# Patient Record
Sex: Male | Born: 1990 | Race: White | Hispanic: No | Marital: Married | Smoking: Never smoker
Health system: Southern US, Community
[De-identification: ages and names within clinical notes are randomized; demographics above are authoritative.]

---
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
## 2010-08-26 ENCOUNTER — Encounter: Payer: Self-pay | Admitting: Specialist

## 2010-09-07 NOTE — Letter (Signed)
Summary: New Patient letter  [facility]  [address] [place], [place] [address]   Phone: [phone]  Fax: [phone]       11/15/2009 MRN: [id number]  Andrew Spencer [address] [place], [place]  [address]  Dear Andrew Spencer,  Welcome to the Gastroenterology Division at [facility].    You are scheduled to see Dr.  Juanda Chance on 01/09/2010 at 9:15AM on the 3rd floor at [facility], [address]  We ask that you try to arrive at our office 15 minutes prior to your appointment time to allow for check-in.  We would like you to complete the enclosed self-administered evaluation form prior to your visit and bring it with you on the day of your appointment.  We will review it with you.  Also, please bring a complete list of all your medications or, if you prefer, bring the medication bottles and we will list them.  Please bring your insurance card so that we may make a copy of it.  If your insurance requires a referral to see a specialist, please bring your referral form from your primary care physician.  Co-payments are due at the time of your visit and may be paid by cash, check or credit card.     Your office visit will consist of a consult with your physician (includes a physical exam), any laboratory testing he/she may order, scheduling of any necessary diagnostic testing (e.g. x-ray, ultrasound, CT-scan), and scheduling of a procedure (e.g. Endoscopy, Colonoscopy) if required.  Please allow enough time on your schedule to allow for any/all of these possibilities.    If you cannot keep your appointment, please call [phone] to cancel or reschedule prior to your appointment date.  This allows [place] the opportunity to schedule an appointment for another patient in need of care.  If you do not cancel or reschedule by 5 p.m. the business day prior to your appointment date, you will be charged a $50.00 late cancellation/no-show fee.    Thank you for choosing  [facility] for your medical needs.  We appreciate the opportunity to care for you.  Please visit [place] at our website  to learn more about our practice.                     Sincerely,                                                             The Gastroenterology Division

## 2010-12-03 IMAGING — CR DG SHOULDER 2+V*L*
3 series · 3 of 3 positions shown · non-contrast
Comparison: None

CLINICAL DATA: Left shoulder injury.

LEFT SHOULDER - 2+ VIEW

[w shoulder ap internal left]
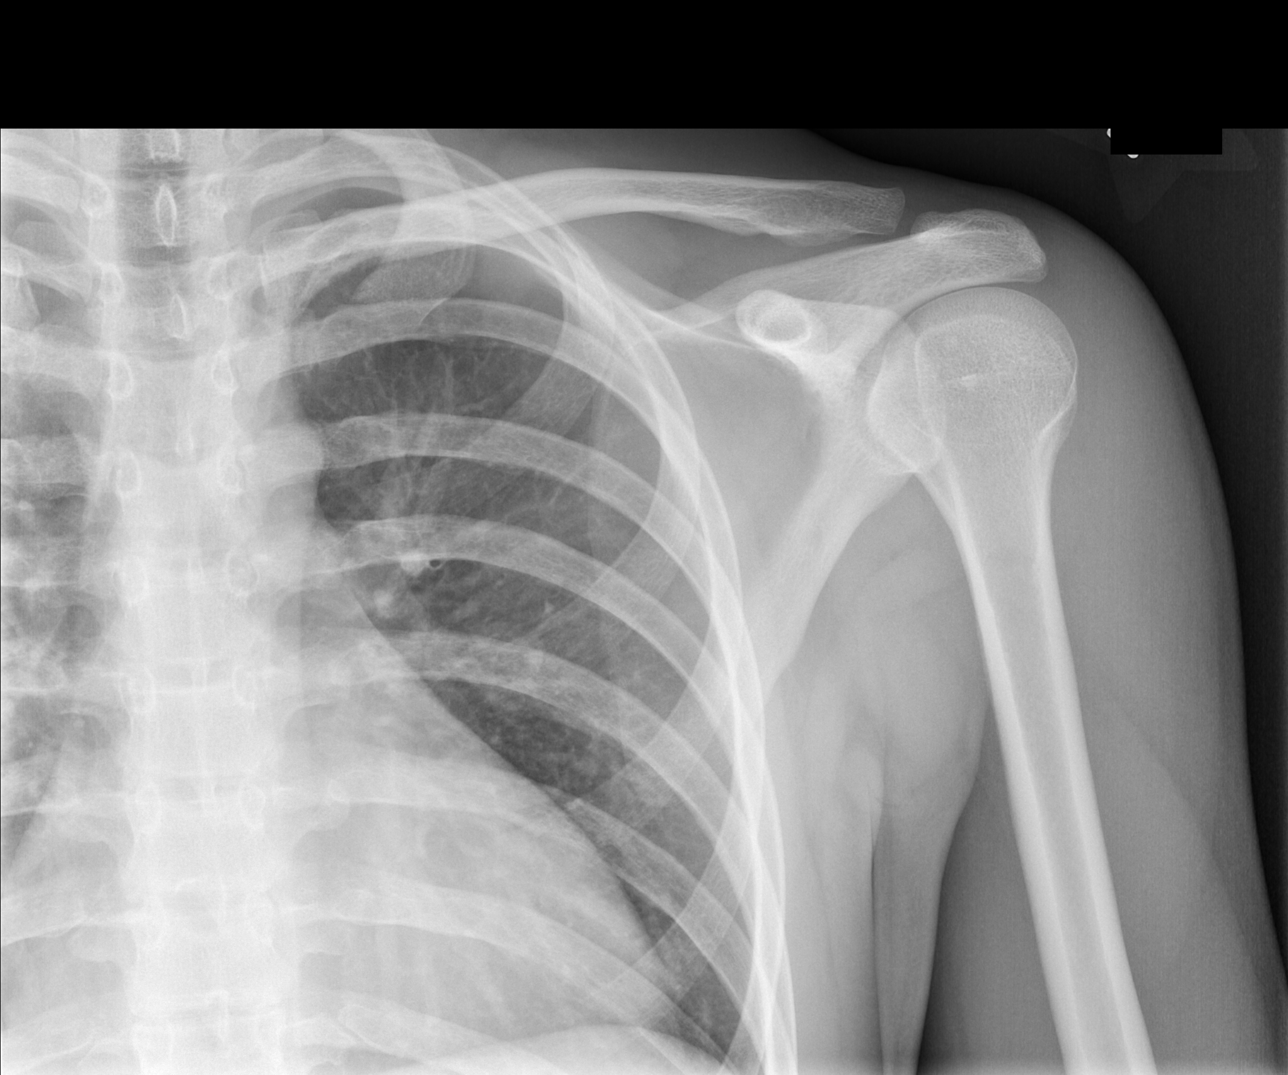

[w shoulder ap external left]
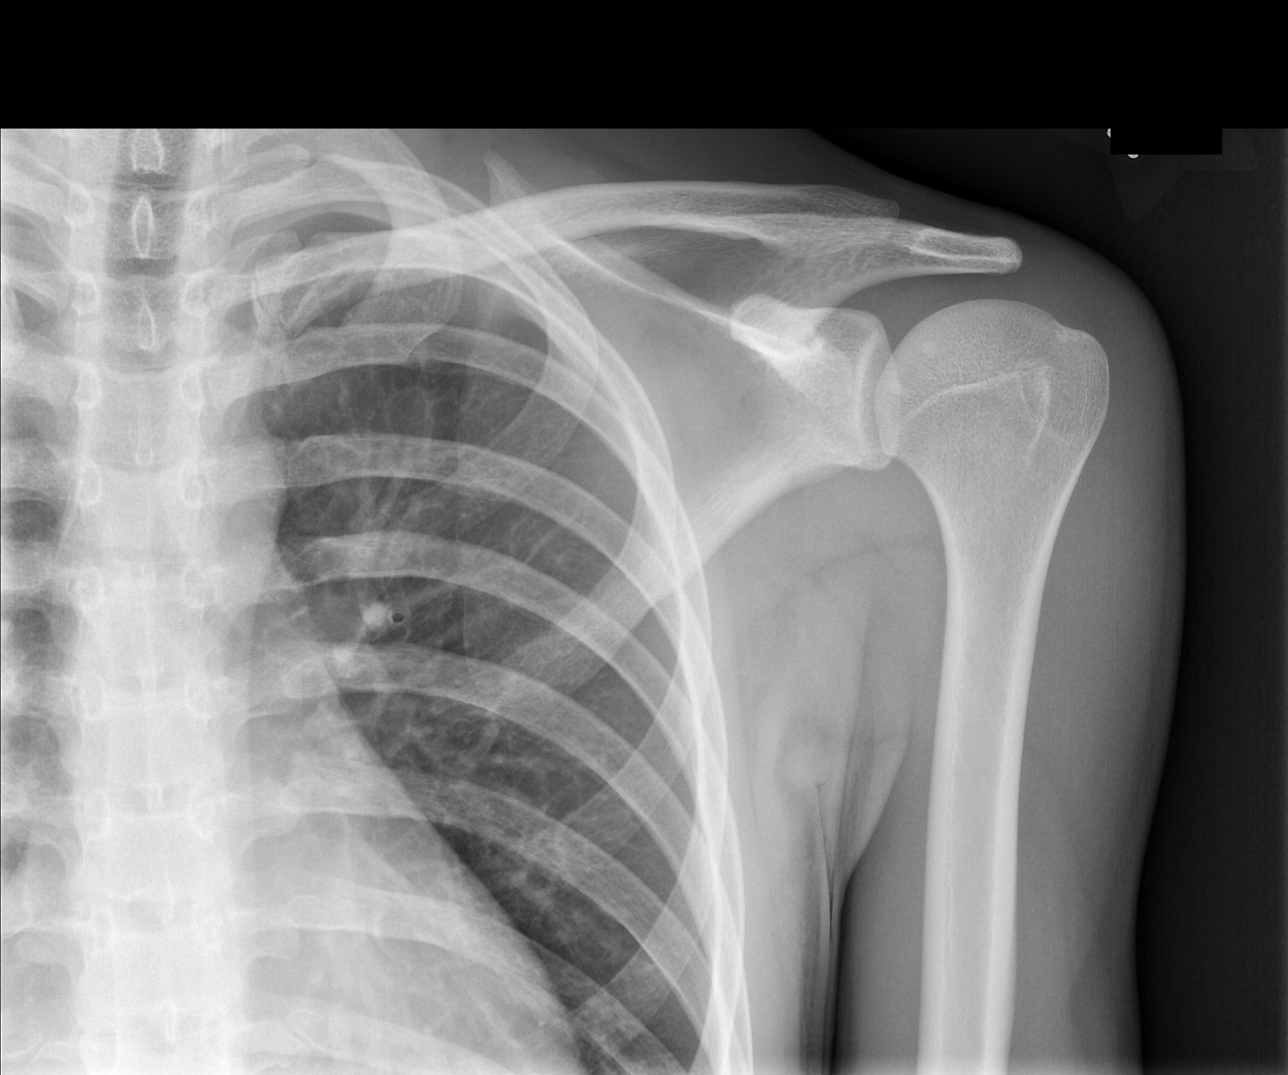

[x shoulder axillary left]
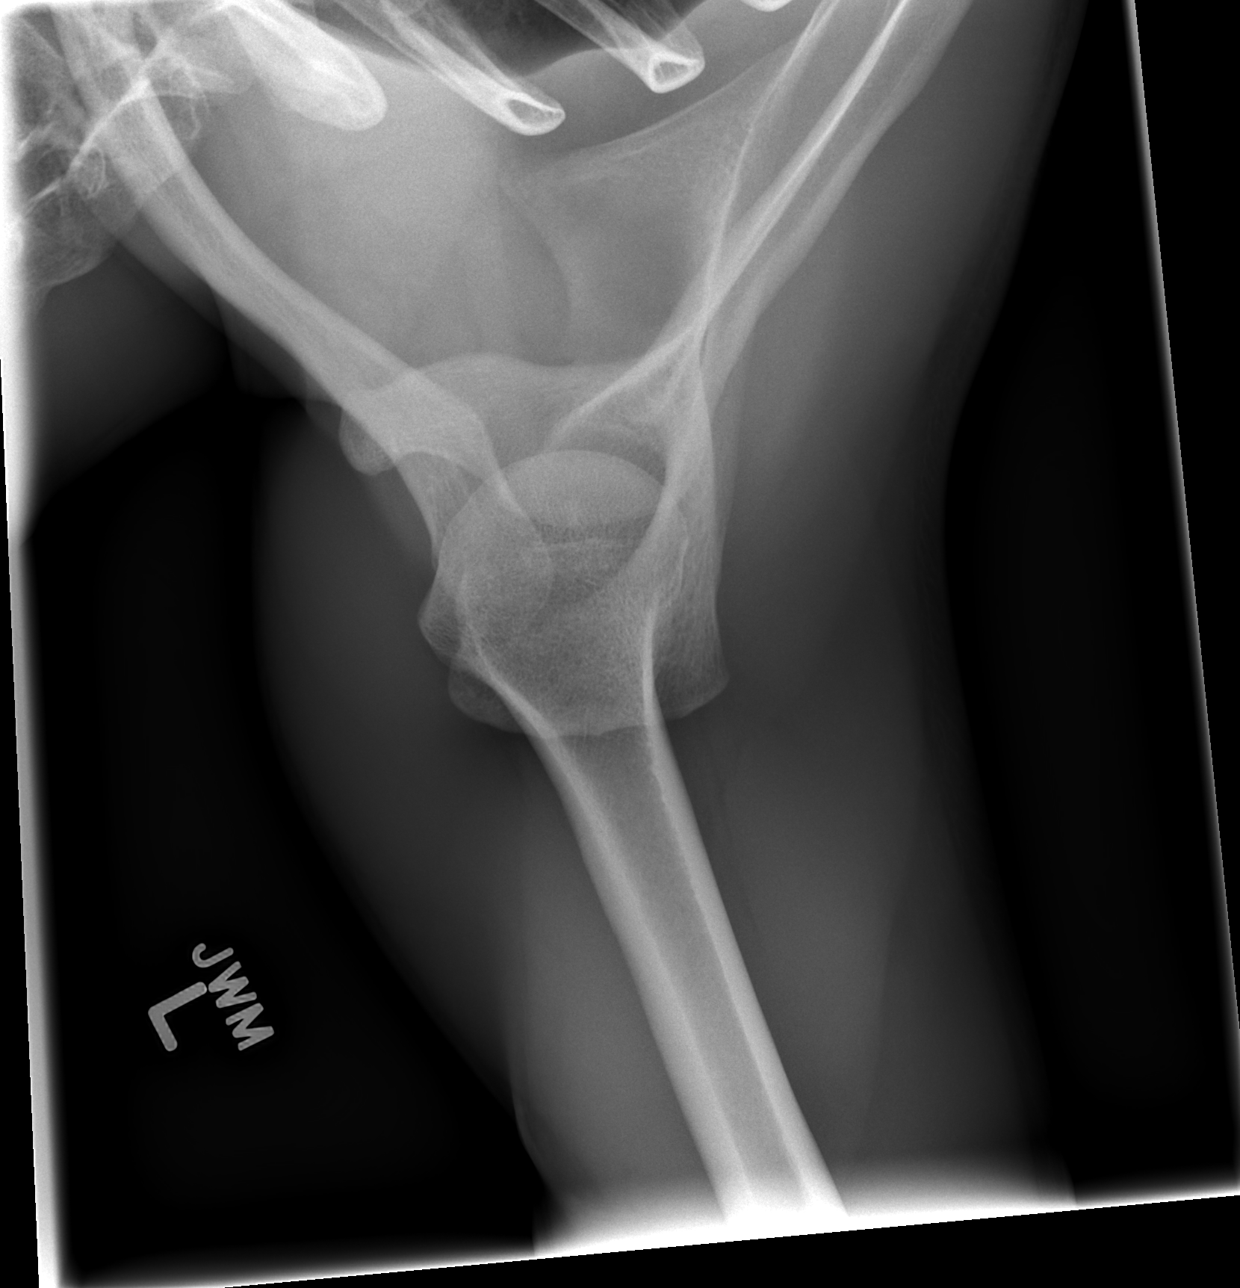

[3 of 3 positions shown; findings below may reference images not displayed]

FINDINGS: Mineralization and alignment are normal.  There is no
evidence of acute fracture or dislocation.  The left clavicle
appears normal.
IMPRESSION: No acute osseous findings.

## 2022-04-06 ENCOUNTER — Encounter: Payer: Self-pay | Admitting: *Deleted

## 2022-05-07 ENCOUNTER — Ambulatory Visit (INDEPENDENT_AMBULATORY_CARE_PROVIDER_SITE_OTHER): Payer: BC Managed Care – PPO | Admitting: Pulmonary Disease

## 2022-05-07 ENCOUNTER — Encounter: Payer: Self-pay | Admitting: Pulmonary Disease

## 2022-05-07 VITALS — BP 118/76 | HR 79 | Temp 97.9°F | Ht 66.0 in | Wt 173.0 lb

## 2022-05-07 DIAGNOSIS — R0683 Snoring: Secondary | ICD-10-CM

## 2022-05-07 NOTE — Addendum Note (Signed)
Addended by: Dessie Coma on: 05/07/2022 12:02 PM   Modules accepted: Orders

## 2022-05-07 NOTE — Progress Notes (Signed)
Andrew Spencer    [id number]    [birth year]  Primary Care Physician:Patient, No Pcp Per  Referring Physician: No referring provider defined for this encounter.  Chief complaint:   Patient being seen for history of snoring  HPI:  Longstanding history of snoring  Spouse has told him recently that he wakes up the baby with his snoring  Dry mouth open occasions in the morning, occasional morning headaches -Does have a retainer that is used for many years, the retainer was fashioned for teeth that did not descend -Without the retainer in place he does have a dry mouth and headaches  Mom does have sleep apnea  Usually goes to bed about 10 to 10:30 PM Falls asleep quickly 1-2 awakenings Final wake up time about 4:30 AM  Never smoker  He does drink caffeinated beverages to try and stay alert  He is a IT trainer, runs his own business   No outpatient encounter medications on file as of 05/07/2022.   No facility-administered encounter medications on file as of 05/07/2022.    Allergies as of 05/07/2022 - Review Complete 05/07/2022  Allergen Reaction Noted   Codeine Other (See Comments)    Gel lined moisturizing gloves [emollient] Rash 05/07/2022    History reviewed. No pertinent past medical history.  History reviewed. No pertinent surgical history.  History reviewed. No pertinent family history.  Social History   Socioeconomic History   Marital status: Married    Spouse name: Not on file   Number of children: Not on file   Years of education: Not on file   Highest education level: Not on file  Occupational History   Not on file  Tobacco Use   Smoking status: Never    Passive exposure: Never   Smokeless tobacco: Never  Substance and Sexual Activity   Alcohol use: Never   Drug use: Never   Sexual activity: Not Currently  Other Topics Concern   Not on file  Social History Narrative   Not on file   Social Determinants of Health   Financial Resource  Strain: Not on file  Food Insecurity: Not on file  Transportation Needs: Not on file  Physical Activity: Not on file  Stress: Not on file  Social Connections: Not on file  Intimate Partner Violence: Not on file    Review of Systems  Psychiatric/Behavioral:  Positive for sleep disturbance.     Vitals:   05/07/22 0903  BP: 118/76  Pulse: 79  Temp: 97.9 F (36.6 C)  SpO2: 98%     Physical Exam Constitutional:      Appearance: Normal appearance.  HENT:     Mouth/Throat:     Mouth: Mucous membranes are moist.  Eyes:     Pupils: Pupils are equal, round, and reactive to light.  Cardiovascular:     Rate and Rhythm: Normal rate and regular rhythm.     Heart sounds: No murmur heard.    No friction rub.  Pulmonary:     Effort: No respiratory distress.     Breath sounds: No stridor. No wheezing or rhonchi.  Musculoskeletal:     Cervical back: No rigidity or tenderness.  Neurological:     Mental Status: He is alert.  Psychiatric:        Mood and Affect: Mood normal.       05/07/2022    9:00 AM  Results of the Epworth flowsheet  Sitting and reading 0  Watching TV 0  Sitting, inactive in a public place (e.g. a theatre or a meeting) 0  As a passenger in a car for an hour without a break 0  Lying down to rest in the afternoon when circumstances permit 0  Sitting and talking to someone 0  Sitting quietly after a lunch without alcohol 0  In a car, while stopped for a few minutes in traffic 0  Total score 0     Data Reviewed: Previous sleep study available  Assessment:  Snoring  Probability of significant obstructive sleep apnea  May have primary snoring  In order to definitely rule out significant obstructive sleep apnea, will benefit from an in lab polysomnogram  Plan/Recommendations:  Schedule for in lab polysomnogram  Sleeping with the head of bed elevated, side sleeping may help snoring  An oral device may also be an option of care, will need  refashioning of device as he does use a retainer at present  Tentative follow-up in 3 to 4 months    Sherrilyn Rist MD  Pulmonary and Critical Care 05/07/2022, 9:09 AM  CC: No ref. provider found

## 2022-05-07 NOTE — Patient Instructions (Signed)
I will schedule you for an in lab polysomnogram  Behavioral changes that may help snoring include trying to sleep on your sides, limiting sleeping on your back Sleeping with the head of the bed elevated  A study that is done in the lab is the gold standard to make sure you do not have significant sleep apnea  Options of treatment as we discussed today  I will see you back in the office in about 3 to 4 months  Call with significant concerns

## 2022-07-17 ENCOUNTER — Ambulatory Visit (HOSPITAL_BASED_OUTPATIENT_CLINIC_OR_DEPARTMENT_OTHER): Payer: BC Managed Care – PPO | Attending: Pulmonary Disease | Admitting: Pulmonary Disease

## 2022-07-17 DIAGNOSIS — R0683 Snoring: Secondary | ICD-10-CM | POA: Diagnosis present

## 2022-07-31 ENCOUNTER — Telehealth: Payer: Self-pay | Admitting: Pulmonary Disease

## 2022-07-31 NOTE — Telephone Encounter (Signed)
Call patient  Sleep study result  Date of study: 07/17/2022  Impression: Primary snoring  Recommendation:  Consider interventions to manage snoring  Weight loss measures  Optimize sleep hygiene  Follow-up as previously scheduled

## 2022-07-31 NOTE — Telephone Encounter (Signed)
Spoke with the pt and notified of results per Dr Val Eagle  He verbalized understanding  He declined rov at this time and says he will call back to schedule if needed

## 2022-07-31 NOTE — Procedures (Signed)
POLYSOMNOGRAPHY  Last, First: Kenry, Daubert MRN: [id number] Gender: Male Age (years): 31 Weight (lbs): 175 DOB: 06-Jul-1991 BMI: 29 Primary Care: No PCP Epworth Score: 0 Referring: Tomma Lightning MD Technician: Rosette Reveal Interpreting: Tomma Lightning MD Study Type: NPSG Ordered Study Type: Split Night CPAP Study date: 07/17/2022 Location: [place] CLINICAL INFORMATION Nikholas Geffre is a 31 year old Male and was referred to the sleep center for evaluation of G47.80 Other Sleep Disorders. Indications include Snoring.  MEDICATIONS Patient self administered medications include: N/A. Medications administered during study include No sleep medicine administered.  SLEEP STUDY TECHNIQUE A multi-channel overnight Polysomnography study was performed. The channels recorded and monitored were central and occipital EEG, electrooculogram (EOG), submentalis EMG (chin), nasal and oral airflow, thoracic and abdominal wall motion, anterior tibialis EMG, snore microphone, electrocardiogram, and a pulse oximetry. TECHNICIAN COMMENTS Comments added by Technician: None Comments added by Scorer: N/A SLEEP ARCHITECTURE The study was initiated at 11:15:27 PM and terminated at 5:19:30 AM. The total recorded time was 364 minutes. EEG confirmed total sleep time was 346.5 minutes yielding a sleep efficiency of 95.2%. Sleep onset after lights out was 1.3 minutes with a REM latency of 91.0 minutes. The patient spent 4.2% of the night in stage N1 sleep, 71.9% in stage N2 sleep, 0.0% in stage N3 and 24% in REM. Wake after sleep onset (WASO) was 16.3 minutes. The Arousal Index was 7.6/hour. RESPIRATORY PARAMETERS There were a total of 14 respiratory disturbances out of which 0 were apneas ( 0 obstructive, 0 mixed, 0 central) and 14 hypopneas. The apnea/hypopnea index (AHI) was 2.4 events/hour. The central sleep apnea index was 0 events/hour. The REM AHI was 4.3 events/hour and NREM AHI was 1.8 events/hour. The  supine AHI was 3.4 events/hour and the non supine AHI was 1.6 supine during 45.45% of sleep. Respiratory disturbances were associated with oxygen desaturation down to a nadir of 88.0% during sleep. The mean oxygen saturation during the study was 94.5%. The cumulative time under 88% oxygen saturation was 5.5 minutes.  LEG MOVEMENT DATA The total leg movements were 0 with a resulting leg movement index of 0.0/hr .Associated arousal with leg movement index was 0.0/hr.  CARDIAC DATA The underlying cardiac rhythm was most consistent with sinus rhythm. Mean heart rate during sleep was 55.1 bpm. Additional rhythm abnormalities include None.  IMPRESSIONS - No Significant Obstructive Sleep apnea(OSA) - EKG showed no cardiac abnormalities. - Mild Oxygen Desaturation - The patient snored with loud snoring volume. - No significant periodic leg movements(PLMs) during sleep. However, no significant associated arousals.  DIAGNOSIS - Snoring  RECOMMENDATIONS - Consider interventions to manage snoring - Avoid alcohol, sedatives and other CNS depressants that may worsen sleep apnea and disrupt normal sleep architecture. - Sleep hygiene should be reviewed to assess factors that may improve sleep quality. - Weight management and regular exercise should be initiated or continued.  [Electronically signed] 07/31/2022 05:31 AM  Virl Diamond MD NPI: [id number]
